# Patient Record
Sex: Male | Born: 1955 | Race: White | Hispanic: No | Smoking: Never smoker
Health system: Southern US, Community
[De-identification: ages and names within clinical notes are randomized; demographics above are authoritative.]

## PROBLEM LIST (undated history)

## (undated) HISTORY — PX: REFRACTIVE SURGERY: SHX103

## (undated) HISTORY — PX: ROTATOR CUFF REPAIR: SHX139

## (undated) HISTORY — PX: MENISCUS REPAIR: SHX5179

---

## 2000-02-08 ENCOUNTER — Other Ambulatory Visit: Admission: RE | Admit: 2000-02-08 | Discharge: 2000-02-08 | Payer: Self-pay | Admitting: Urology

## 2000-02-08 ENCOUNTER — Encounter (INDEPENDENT_AMBULATORY_CARE_PROVIDER_SITE_OTHER): Payer: Self-pay | Admitting: Specialist

## 2005-10-23 ENCOUNTER — Ambulatory Visit: Payer: Self-pay | Admitting: Gastroenterology

## 2005-11-01 ENCOUNTER — Ambulatory Visit: Payer: Self-pay | Admitting: Gastroenterology

## 2005-12-13 ENCOUNTER — Ambulatory Visit (HOSPITAL_BASED_OUTPATIENT_CLINIC_OR_DEPARTMENT_OTHER): Admission: RE | Admit: 2005-12-13 | Discharge: 2005-12-13 | Payer: Self-pay | Admitting: Orthopedic Surgery

## 2008-09-07 ENCOUNTER — Ambulatory Visit (HOSPITAL_COMMUNITY): Admission: RE | Admit: 2008-09-07 | Discharge: 2008-09-07 | Payer: Self-pay | Admitting: Otolaryngology

## 2009-10-09 ENCOUNTER — Ambulatory Visit (HOSPITAL_COMMUNITY): Admission: RE | Admit: 2009-10-09 | Discharge: 2009-10-09 | Payer: Self-pay | Admitting: Otolaryngology

## 2010-05-26 LAB — CBC
Hemoglobin: 15 g/dL (ref 13.0–17.0)
MCH: 30 pg (ref 26.0–34.0)
MCHC: 33.4 g/dL (ref 30.0–36.0)
Platelets: 283 10*3/uL (ref 150–400)

## 2010-05-26 LAB — SURGICAL PCR SCREEN
MRSA, PCR: NEGATIVE
Staphylococcus aureus: NEGATIVE

## 2010-06-18 LAB — CBC
Platelets: 275 10*3/uL (ref 150–400)
RDW: 13.3 % (ref 11.5–15.5)
WBC: 4.3 10*3/uL (ref 4.0–10.5)

## 2010-06-18 LAB — BASIC METABOLIC PANEL
BUN: 8 mg/dL (ref 6–23)
Calcium: 8.7 mg/dL (ref 8.4–10.5)
Creatinine, Ser: 0.71 mg/dL (ref 0.4–1.5)
GFR calc non Af Amer: >60 mL/min (ref >60)
Glucose, Bld: 113 mg/dL — ABNORMAL HIGH (ref 70–99)
Potassium: 4.4 mEq/L (ref 3.5–5.1)

## 2010-07-24 NOTE — Op Note (Signed)
[name], Earl Moore              ACCOUNT NO.:  [id number]   MEDICAL RECORD NO.:  [id number]          PATIENT TYPE:  AMB   LOCATION:  SDS                          FACILITY:  [facility]   PHYSICIAN:  Antony Contras, MD     DATE OF BIRTH:  May 18, 1955   DATE OF PROCEDURE:  09/07/2008  DATE OF DISCHARGE:  09/07/2008                               OPERATIVE REPORT   PREOPERATIVE DIAGNOSES:  1. Hoarseness.  2. Vocal fold atrophy.   POSTOPERATIVE DIAGNOSES:  1. Hoarseness.  2. Vocal fold atrophy.   PROCEDURE:  Suspended microdirect laryngoscopy with bilateral vocal cord  radius injections.   SURGEON:  Excell Seltzer. Jenne Pane, MD   ANESTHESIA:  General jet Venturi ventilation.   COMPLICATIONS:  None.   INDICATIONS:  The patient is a 55 year old white male who has taught  high school students for many years, but has found over the last 9  months or so that with more voice use comes fatiguing of voice and  throat soreness.  Examination of the larynx demonstrated slight gap with  phonation with slight bowing of the vocal folds.  Thus, he presents to  the operating room for surgical management.   FINDINGS:  Vocal folds are without mass, cysts, or ulceration.  Both  vocal folds have a mildly bowed appearance.   DESCRIPTION OF PROCEDURE:  The patient was identified in the holding  room and informed consent having been obtained including discussion of  risks, benefits, and alternatives.  The patient was brought to the  operative suite and put on the operating table in supine position.  Anesthesia was induced.  The patient was maintained via mask  ventilation.  The eyes were taped closed and the bed was turned to 90  degrees from anesthesia.  A tooth guard was placed and a Storz  laryngoscope was then put into place into the supraglottic position.  The jet Venturi system was then attached and the patient was  successfully jet ventilated throughout the remainder of the case.  The  vocal folds were  then photographed with a 0-degree telescope.  Radius  was then injected into both vocal fold starting on the right side and a  posterior position with 0.2 mL injected followed by the same on the left  side.  An anterior site was injected on both sides as well with 0.1 mL  on each side totaling 0.3 mL per side.  Excess injection was suctioned  and the vocal folds were then sprayed with 2% topical lidocaine.  A  postoperative photograph was taken, and the  laryngoscope was then taken out of suspension and then removed from the  patient's mouth while suctioning the airway.  He was then turned back to  Anesthesia for wake up under mask ventilation and was moved to the  recovery room in stable condition.      Antony Contras, MD  Electronically Signed     Antony Contras, MD  Electronically Signed    DDB/MEDQ  D:  09/07/2008  T:  09/08/2008  Job:  [id number]

## 2010-07-27 NOTE — Op Note (Signed)
NAME:  Earl Moore, Earl Moore              ACCOUNT NO.:  [id number]   MEDICAL RECORD NO.:  [id number]          PATIENT TYPE:  AMB   LOCATION:  DSC                          FACILITY:  [facility]   PHYSICIAN:  Harvie Junior, M.D.   DATE OF BIRTH:  17-May-1955   DATE OF PROCEDURE:  12/13/2005  DATE OF DISCHARGE:                                 OPERATIVE REPORT   PREOPERATIVE DIAGNOSIS:  Medial meniscal tear.   POSTOPERATIVE DIAGNOSES:  1. Medial meniscal tear.  2. Chondromalacia of patella.   PRINCIPLE PROCEDURES:  1. Partial posterior horn medial meniscectomy.  2. Debridement of medial patellar facet.   SURGEON:  Harvie Junior, M.D.   ASSISTANT:  Marshia Ly, P.A.   ANESTHESIA:  General.   BRIEF HISTORY:  [name] is a 55 year old male with a long history of  having had significant medial knee pain.  The patient had been treated with  arthroscopy about 20 years ago, but began having increasing pain, catching  and locking of the knee.  Because of continued pain, catching and locking,  the patient had an MRI performed, which showed that he had a posterior-horn  medial meniscal tear.  The patient was taken to the operating room for  debridement.   PROCEDURE:  The patient was taken to the operating room, and after adequate  anesthesia was obtained with general anesthetic, the patient was placed  supine on the operating table.  The left leg was prepped and draped in the  usual sterile fashion.  Following this, routine arthroscopic examination of  the knee revealed there was an obvious posterior-horn medial meniscal tear.  This was debrided back to a smooth and stable rim.  It was a little bit  flapped up medially, and this was debrided as well.  The remaining meniscal  rim was contoured down with a suction shaver.  A probe was used to make sure  it was stable.  Attention was turned to the medial femoral condyle, which  had really just some mild changes.  There was not any significant  chondral  injury.  Attention was turned to the ACL, which was normal and normal under  stretch.  Attention was turned laterally, where there was no significant  abnormality.  There may have been a touch of chondral issue posteriorly, but  not needing to be addressed at all.  Attention was turned back to the  patellofemoral joint, where the patellofemoral joint tracked well and looked  good, but on the medial patellar facet was a little bit of chondromalacia,  which was debrided.  At this point, the knee was copiously  irrigated and suctioned dry.  A final check was made for any loosened  fragments or pieces.  Seeing none, the knee was closed with a sterile and  compressive dressing, and the patient was taken to recovery, where she was  noted to be in satisfactory condition.  The estimated blood loss for the  procedure was negligible.      Harvie Junior, M.D.  Electronically Signed     JLG/MEDQ  D:  12/13/2005  T:  12/14/2005  Job:  [id number]

## 2011-09-04 ENCOUNTER — Telehealth: Payer: Self-pay | Admitting: Gastroenterology

## 2011-09-04 NOTE — Telephone Encounter (Signed)
Pulled up report on 11/01/2005, and pt had a normal, clean COLON. No chart available, but in this case recall would be in 2017. Phoned pt who states he's having no problems at present. I got cut off when I asked about his family history and am waiting on him to call back. I did speak with pt again and he would like up to run a COLON through and if [employer] denies the procedure, he would like an estimate on what [employer] will pay. He is traveling and will call me back.

## 2011-09-19 NOTE — Telephone Encounter (Signed)
Pt never called me back

## 2013-11-23 ENCOUNTER — Other Ambulatory Visit: Payer: Self-pay | Admitting: *Deleted

## 2018-04-06 ENCOUNTER — Emergency Department (HOSPITAL_COMMUNITY)
Admission: EM | Admit: 2018-04-06 | Discharge: 2018-04-07 | Disposition: A | Discharge status: Home / Self Care | Payer: BC Managed Care – PPO | Attending: Emergency Medicine | Admitting: Emergency Medicine

## 2018-04-06 ENCOUNTER — Other Ambulatory Visit: Payer: Self-pay

## 2018-04-06 ENCOUNTER — Emergency Department (HOSPITAL_COMMUNITY): Payer: BC Managed Care – PPO

## 2018-04-06 ENCOUNTER — Encounter (HOSPITAL_COMMUNITY): Payer: Self-pay

## 2018-04-06 DIAGNOSIS — Z79899 Other long term (current) drug therapy: Secondary | ICD-10-CM | POA: Insufficient documentation

## 2018-04-06 DIAGNOSIS — R1032 Left lower quadrant pain: Secondary | ICD-10-CM | POA: Diagnosis present

## 2018-04-06 DIAGNOSIS — K5732 Diverticulitis of large intestine without perforation or abscess without bleeding: Secondary | ICD-10-CM | POA: Insufficient documentation

## 2018-04-06 LAB — CBC
HCT: 44.7 % (ref 39.0–52.0)
Hemoglobin: 14.9 g/dL (ref 13.0–17.0)
MCH: 30.6 pg (ref 26.0–34.0)
MCHC: 33.3 g/dL (ref 30.0–36.0)
MCV: 91.8 fL (ref 80.0–100.0)
Platelets: 320 10*3/uL (ref 150–400)
RBC: 4.87 MIL/uL (ref 4.22–5.81)
RDW: 13.1 % (ref 11.5–15.5)
WBC: 9.2 10*3/uL (ref 4.0–10.5)
nRBC: 0 % (ref 0.0–0.2)

## 2018-04-06 LAB — BASIC METABOLIC PANEL
Anion gap: 8 (ref 5–15)
BUN: 10 mg/dL (ref 8–23)
CHLORIDE: 99 mmol/L (ref 98–111)
CO2: 30 mmol/L (ref 22–32)
CREATININE: 0.86 mg/dL (ref 0.61–1.24)
Calcium: 9.4 mg/dL (ref 8.9–10.3)
GFR calc non Af Amer: >60 mL/min (ref >60)
GLUCOSE: 113 mg/dL — AB (ref 70–99)
Potassium: 4.5 mmol/L (ref 3.5–5.1)
Sodium: 137 mmol/L (ref 135–145)

## 2018-04-06 LAB — URINALYSIS, ROUTINE W REFLEX MICROSCOPIC
Bilirubin Urine: NEGATIVE
GLUCOSE, UA: NEGATIVE mg/dL
HGB URINE DIPSTICK: NEGATIVE
KETONES UR: 5 mg/dL — AB
Leukocytes, UA: NEGATIVE
NITRITE: NEGATIVE
PH: 7 (ref 5.0–8.0)
Protein, ur: NEGATIVE mg/dL
SPECIFIC GRAVITY, URINE: 1.024 (ref 1.005–1.030)

## 2018-04-06 LAB — HEPATIC FUNCTION PANEL
ALBUMIN: 4.3 g/dL (ref 3.5–5.0)
ALT: 30 U/L (ref 0–44)
AST: 17 U/L (ref 15–41)
Alkaline Phosphatase: 58 U/L (ref 38–126)
Bilirubin, Direct: 0.2 mg/dL (ref 0.0–0.2)
Indirect Bilirubin: 0.6 mg/dL (ref 0.3–0.9)
Total Bilirubin: 0.8 mg/dL (ref 0.3–1.2)
Total Protein: 7 g/dL (ref 6.5–8.1)

## 2018-04-06 LAB — LIPASE, BLOOD: LIPASE: 30 U/L (ref 11–51)

## 2018-04-06 MED ORDER — SODIUM CHLORIDE 0.9 % IV BOLUS
500.0000 mL | Freq: Once | INTRAVENOUS | Status: AC
  Administered 2018-04-06: 500 mL via INTRAVENOUS

## 2018-04-06 MED ORDER — CIPROFLOXACIN HCL 500 MG PO TABS: 500.0000 mg | ORAL_TABLET | Freq: Two times a day (BID) | ORAL | 0 refills | Status: AC

## 2018-04-06 MED ORDER — SODIUM CHLORIDE (PF) 0.9 % IJ SOLN
INTRAMUSCULAR | Status: AC
  Filled 2018-04-06: qty 50

## 2018-04-06 MED ORDER — TRAMADOL HCL 50 MG PO TABS: 50.0000 mg | ORAL_TABLET | Freq: Four times a day (QID) | ORAL | 0 refills | Status: AC | PRN

## 2018-04-06 MED ORDER — METRONIDAZOLE 500 MG PO TABS: 500.0000 mg | ORAL_TABLET | Freq: Two times a day (BID) | ORAL | 0 refills | Status: AC

## 2018-04-06 MED ORDER — IOPAMIDOL (ISOVUE-300) INJECTION 61%
INTRAVENOUS | Status: AC
  Filled 2018-04-06: qty 100

## 2018-04-06 MED ORDER — METRONIDAZOLE IN NACL 5-0.79 MG/ML-% IV SOLN
500.0000 mg | Freq: Once | INTRAVENOUS | Status: AC
  Administered 2018-04-06: 500 mg via INTRAVENOUS
  Filled 2018-04-06: qty 100

## 2018-04-06 MED ORDER — IOPAMIDOL (ISOVUE-300) INJECTION 61%
100.0000 mL | Freq: Once | INTRAVENOUS | Status: AC | PRN
  Administered 2018-04-06: 100 mL via INTRAVENOUS

## 2018-04-06 MED ORDER — CIPROFLOXACIN IN D5W 400 MG/200ML IV SOLN
400.0000 mg | Freq: Once | INTRAVENOUS | Status: AC
  Administered 2018-04-06: 400 mg via INTRAVENOUS
  Filled 2018-04-06: qty 200

## 2018-04-06 NOTE — ED Provider Notes (Signed)
[facility]-EMERGENCY DEPT Provider Note   CSN: [id number] Arrival date & time: 04/06/18  1624     History   Chief Complaint Chief Complaint  Patient presents with  . Flank Pain  . Fever    HPI Earl Moore is a 63 y.o. male.  HPI Patient presents with lower abdominal pain and left flank pain which started yesterday.  States has had multiple episodes of loose stools.  He has had subjective fevers and chills.  He denies any nausea or vomiting.  Denies hematuria, dysuria, frequency or urgency.  States his pain has improved since being in the emergency department. History reviewed. No pertinent past medical history.  There are no active problems to display for this patient.   Past Surgical History:  Procedure Laterality Date  . MENISCUS REPAIR Bilateral   . REFRACTIVE SURGERY    . ROTATOR CUFF REPAIR Right         Home Medications    Prior to Admission medications   Medication Sig Start Date End Date Taking? Authorizing Provider  diphenhydramine-acetaminophen (TYLENOL PM) 25-500 MG TABS tablet Take 2 tablets by mouth at bedtime as needed (fever).   Yes [provider]  minoxidil (ROGAINE) 2 % external solution Apply 1 application topically daily.   Yes [provider]  Multiple Vitamins-Minerals (CENTRUM SILVER PO) Take 1 tablet by mouth daily.   Yes [provider]  OVER THE COUNTER MEDICATION Take 1 tablet by mouth daily.   Yes [provider]  ciprofloxacin (CIPRO) 500 MG tablet Take 1 tablet (500 mg total) by mouth 2 (two) times daily. One po bid x 7 days 04/06/18   Loren RacerYelverton, Leveta Wahab, MD  metroNIDAZOLE (FLAGYL) 500 MG tablet Take 1 tablet (500 mg total) by mouth 2 (two) times daily. One po bid x 7 days 04/06/18   Loren RacerYelverton, Earl Down, MD  traMADol (ULTRAM) 50 MG tablet Take 1 tablet (50 mg total) by mouth every 6 (six) hours as needed for moderate pain or severe pain. 04/06/18   Loren RacerYelverton, Earl Bautch, MD    Family  History No family history on file.  Social History Social History   Tobacco Use  . Smoking status: Never Smoker  . Smokeless tobacco: Never Used  Substance Use Topics  . Alcohol use: Yes    Alcohol/week: 20.0 standard drinks    Types: 20 Shots of liquor per week  . Drug use: Never     Allergies   Penicillins   Review of Systems Review of Systems  Constitutional: Positive for chills and fever. Negative for fatigue.  HENT: Negative for sore throat and trouble swallowing.   Respiratory: Negative for cough and shortness of breath.   Cardiovascular: Negative for chest pain.  Gastrointestinal: Positive for abdominal pain and diarrhea. Negative for blood in stool, constipation, nausea and vomiting.  Genitourinary: Positive for flank pain. Negative for dysuria, frequency, hematuria, penile pain, penile swelling and scrotal swelling.  Musculoskeletal: Positive for back pain and myalgias. Negative for neck pain and neck stiffness.  Skin: Negative for rash and wound.  Neurological: Negative for dizziness, weakness, light-headedness, numbness and headaches.  All other systems reviewed and are negative.    Physical Exam Updated Vital Signs BP 105/63 (BP Location: Left Arm)   Pulse 95   Temp 99.1 F (37.3 C) (Oral)   Resp 16   Ht 5\' 7"  (1.702 m)   Wt 77.6 kg   SpO2 98%   BMI 26.78 kg/m   Physical Exam Vitals signs and  nursing note reviewed.  Constitutional:      General: He is not in acute distress.    Appearance: Normal appearance. He is well-developed. He is not ill-appearing or toxic-appearing.  HENT:     Head: Normocephalic and atraumatic.     Nose: Nose normal.     Mouth/Throat:     Mouth: Mucous membranes are moist.     Pharynx: No oropharyngeal exudate or posterior oropharyngeal erythema.  Eyes:     Extraocular Movements: Extraocular movements intact.     Pupils: Pupils are equal, round, and reactive to light.  Neck:     Musculoskeletal: Normal range of motion  and neck supple. No neck rigidity or muscular tenderness.  Cardiovascular:     Rate and Rhythm: Normal rate and regular rhythm.     Heart sounds: No murmur. No friction rub. No gallop.   Pulmonary:     Effort: Pulmonary effort is normal.     Breath sounds: Normal breath sounds.  Abdominal:     General: Bowel sounds are normal. There is no distension.     Palpations: Abdomen is soft. There is no mass.     Tenderness: There is abdominal tenderness. There is no right CVA tenderness, left CVA tenderness, guarding or rebound.     Hernia: No hernia is present.     Comments: Left lower quadrant tenderness to palpation.  No rebound or guarding.  Musculoskeletal: Normal range of motion.        General: Tenderness present.     Right lower leg: No edema.     Left lower leg: No edema.     Comments: No definite CVA tenderness.  Patient does have some mild left lumbar paraspinal muscular tenderness to palpation.  No midline thoracic or lumbar tenderness.  No lower extremity swelling, asymmetry or tenderness.  2+ distal pulses in all extremities.  Lymphadenopathy:     Cervical: No cervical adenopathy.  Skin:    General: Skin is warm and dry.     Findings: No erythema or rash.  Neurological:     General: No focal deficit present.     Mental Status: He is alert and oriented to person, place, and time.  Psychiatric:        Mood and Affect: Mood normal.        Behavior: Behavior normal.      ED Treatments / Results  Labs (all labs ordered are listed, but only abnormal results are displayed) Labs Reviewed  URINALYSIS, ROUTINE W REFLEX MICROSCOPIC - Abnormal; Notable for the following components:      Result Value   Ketones, ur 5 (*)    All other components within normal limits  BASIC METABOLIC PANEL - Abnormal; Notable for the following components:   Glucose, Bld 113 (*)    All other components within normal limits  CBC  HEPATIC FUNCTION PANEL  LIPASE, BLOOD    EKG None  Radiology Ct  Abdomen Pelvis W Contrast  Result Date: 04/06/2018 CLINICAL DATA:  LEFT flank pain, fever. Constipation this morning, now with diarrhea. Suspect diverticulitis. EXAM: CT ABDOMEN AND PELVIS WITH CONTRAST TECHNIQUE: Multidetector CT imaging of the abdomen and pelvis was performed using the standard protocol following bolus administration of intravenous contrast. CONTRAST:  100mL ISOVUE-300 IOPAMIDOL (ISOVUE-300) INJECTION 61% COMPARISON:  None. FINDINGS: LOWER CHEST: RIGHT > LEFT lower lobe atelectasis/scar. Included heart size is normal. No pericardial effusion. HEPATOBILIARY: Liver and gallbladder are normal. PANCREAS: Normal. SPLEEN: Normal. ADRENALS/URINARY TRACT: Kidneys are orthotopic, demonstrating symmetric enhancement.  No nephrolithiasis, hydronephrosis or solid renal masses. The unopacified ureters are normal in course and caliber. Delayed imaging through the kidneys demonstrates symmetric prompt contrast excretion within the proximal urinary collecting system. Urinary bladder is partially distended and unremarkable. Normal adrenal glands. STOMACH/BOWEL: Mild colonic diverticulosis with superimposed acute inflammatory changes about a single descending colonic diverticula. Small and large bowel are normal in course and caliber. VASCULAR/LYMPHATIC: Aortoiliac vessels are normal in course and caliber. Mild calcific atherosclerosis. No lymphadenopathy by CT size criteria. REPRODUCTIVE: Normal. OTHER: Trace free fluid LEFT pericolic gutter without to peritoneal free air or focal fluid collection. MUSCULOSKELETAL: Nonacute. Small fat containing RIGHT inguinal hernia. Moderate L4-5 and moderate to severe L5-S1 spondylosis. IMPRESSION: 1. Acute uncomplicated descending colonic diverticulitis. Aortic Atherosclerosis (ICD10-I70.0). Electronically Signed   By: Awilda Metro M.D.   On: 04/06/2018 21:02    Procedures Procedures (including critical care time)  Medications Ordered in ED Medications  iopamidol  (ISOVUE-300) 61 % injection (has no administration in time range)  sodium chloride (PF) 0.9 % injection (has no administration in time range)  ciprofloxacin (CIPRO) IVPB 400 mg (400 mg Intravenous New Bag/Given 04/06/18 2202)  metroNIDAZOLE (FLAGYL) IVPB 500 mg (has no administration in time range)  sodium chloride 0.9 % bolus 500 mL (0 mLs Intravenous Stopped 04/06/18 2120)  iopamidol (ISOVUE-300) 61 % injection 100 mL (100 mLs Intravenous Contrast Given 04/06/18 2044)     Initial Impression / Assessment and Plan / ED Course  I have reviewed the triage vital signs and the nursing notes.  Pertinent labs & imaging results that were available during my care of the patient were reviewed by me and considered in my medical decision making (see chart for details).    CT with evidence of uncomplicated diverticulitis.  Will treat with IV antibiotics and discharge home with course of oral antibiotics.  Patient is well-appearing and in no acute distress.  Return precautions given.   Final Clinical Impressions(s) / ED Diagnoses   Final diagnoses:  Diverticulitis of large intestine without perforation or abscess without bleeding    ED Discharge Orders         Ordered    ciprofloxacin (CIPRO) 500 MG tablet  2 times daily     04/06/18 2243    metroNIDAZOLE (FLAGYL) 500 MG tablet  2 times daily     04/06/18 2243    traMADol (ULTRAM) 50 MG tablet  Every 6 hours PRN     04/06/18 2243           Loren Racer, MD 04/06/18 2243

## 2018-04-06 NOTE — ED Triage Notes (Signed)
Pt states he has been having left flank pain, fever, and "bubbly belly". Pt states his stomach has felt unsettled. Pt states that he was constipated this morning, but has had 5 or 6 liquid small BMs. Pt denies N/V.  Pt states that this started early Saturday with diarrhea . Pt states on Sunday afternoon, pt started getting the left flank pain, and developed a fever. Pt states he took tylenol PM, and woke up covered in sweat.  Pt states thermometer at home read 99.8; 100.1 at PCP. Pt states flank pain is worse with palpation.

## 2019-10-30 IMAGING — CT CT ABD-PELV W/ CM
2 of 5 series · 16 of 46 positions shown, 18 images · IV contrast (ISOVUE)
Comparison: None.

CLINICAL DATA: LEFT flank pain, fever. Constipation this morning,
now with diarrhea. Suspect diverticulitis.

EXAM:
CT ABDOMEN AND PELVIS WITH CONTRAST
TECHNIQUE: Multidetector CT imaging of the abdomen and pelvis was performed
using the standard protocol following bolus administration of
intravenous contrast.
CONTRAST:  100mL O4YVPV-RII IOPAMIDOL (O4YVPV-RII) INJECTION 61%

[Series 2: axial st · axial · 0.77mm/px · z∈[-498,-88]mm · 13 of 96 slices shown, 15 images]
[im 7/96  soft-tissue]
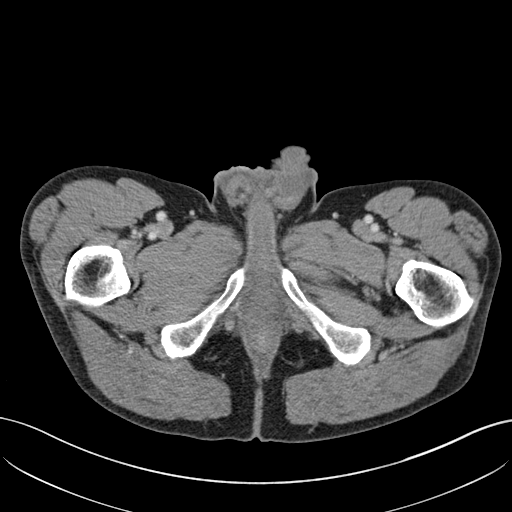
[im 7/96  bone]
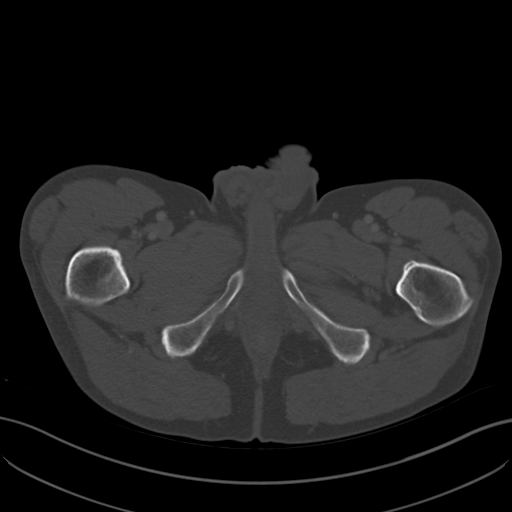
[im 13/96  soft-tissue]
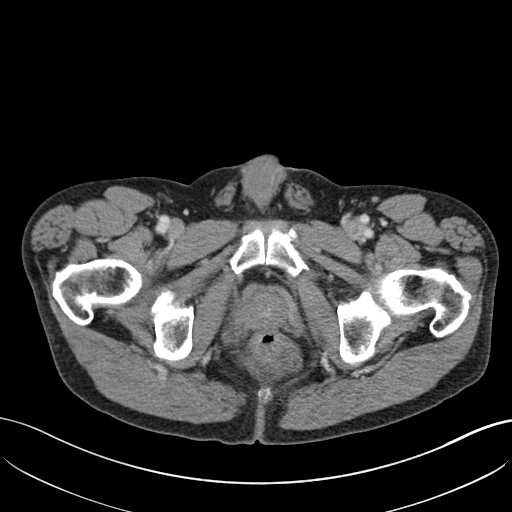
[im 20/96  soft-tissue]
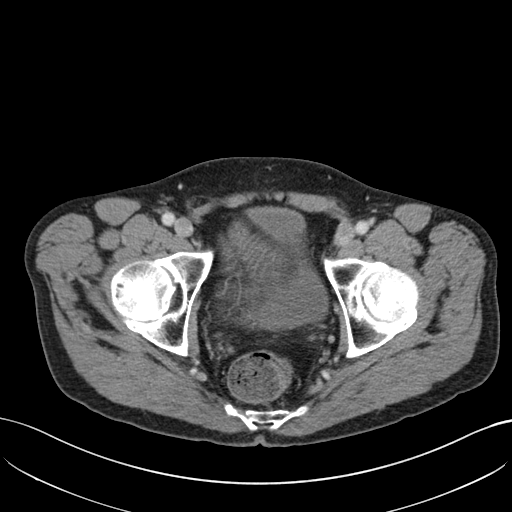
[im 26/96  soft-tissue]
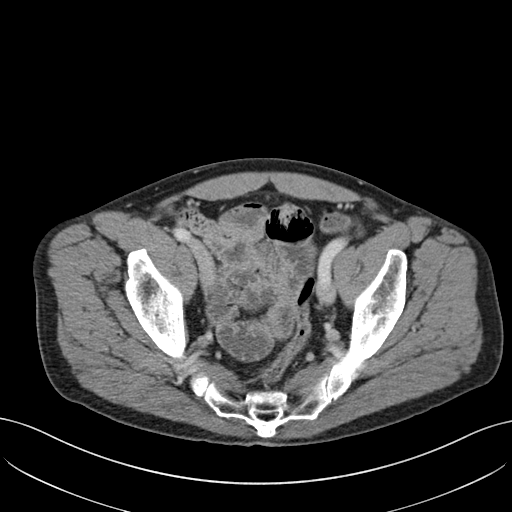
[im 32/96  soft-tissue]
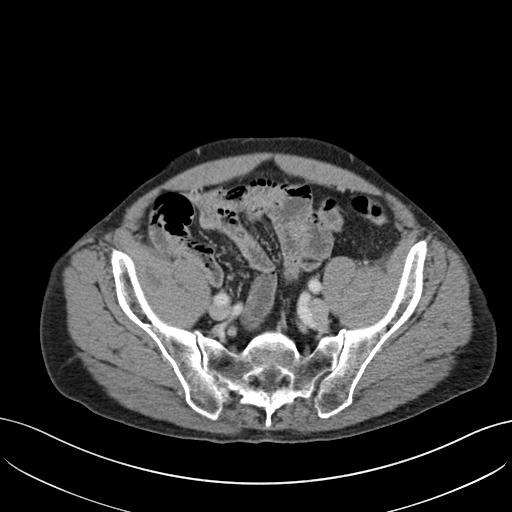
[im 39/96  soft-tissue]
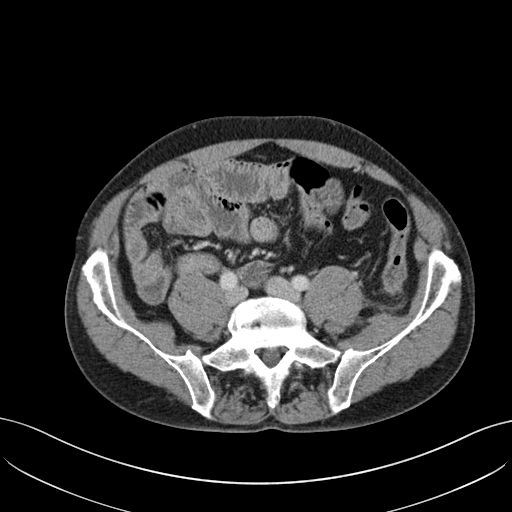
[im 51/96  soft-tissue]
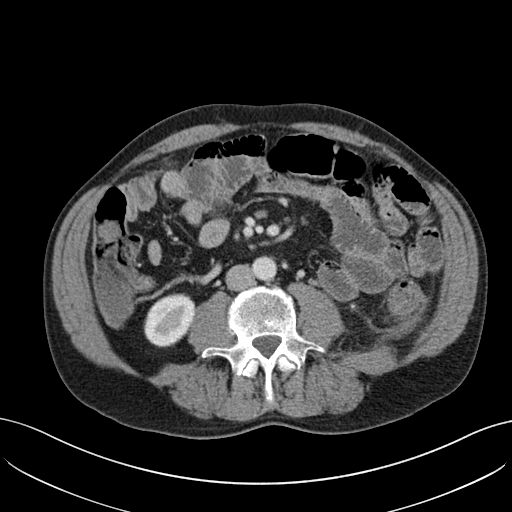
[im 58/96  soft-tissue]
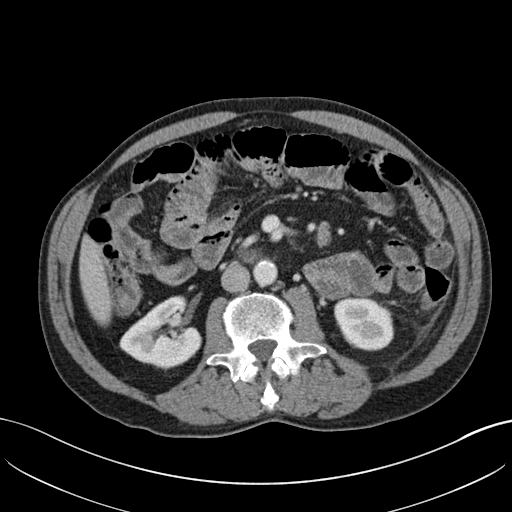
[im 64/96  soft-tissue]
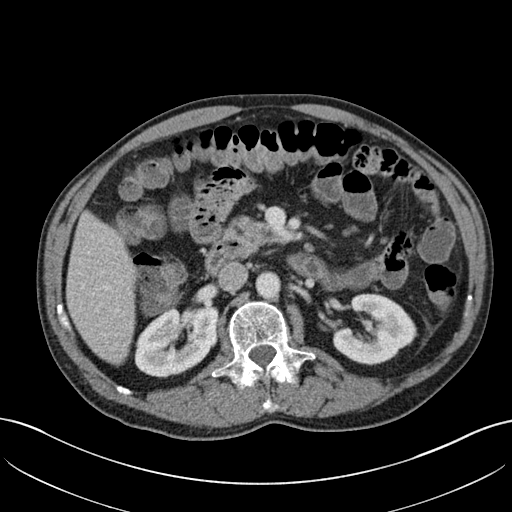
[im 64/96  bone]
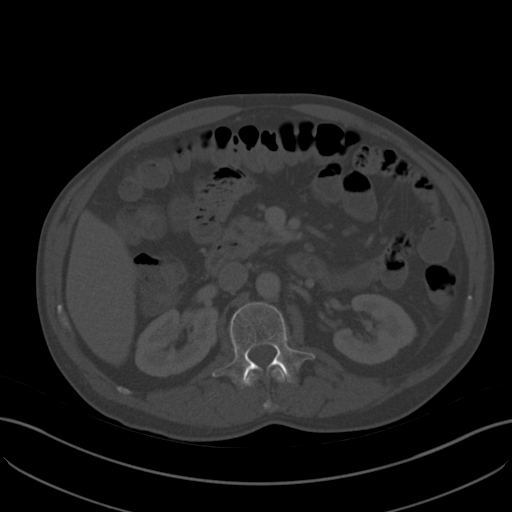
[im 70/96  soft-tissue]
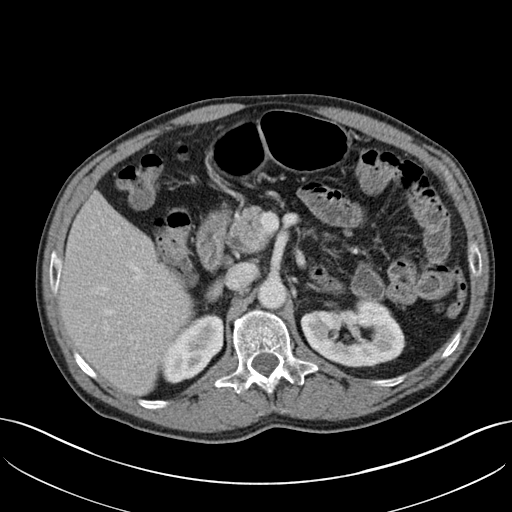
[im 77/96  soft-tissue]
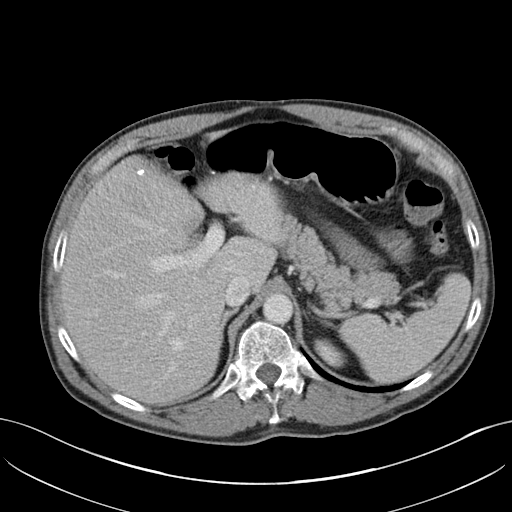
[im 83/96  soft-tissue]
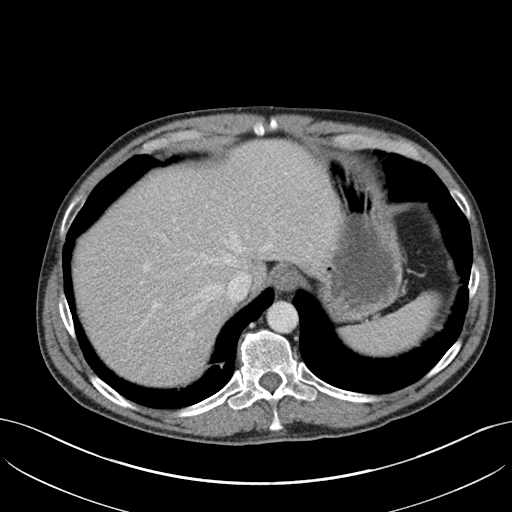
[im 89/96  soft-tissue]
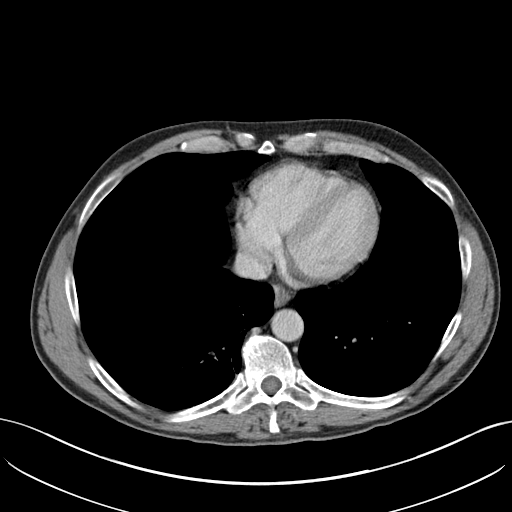

[Series 5: coronal st · coronal · 0.75mm/px · 3 of 87 slices shown]
[im 29/87  soft-tissue]
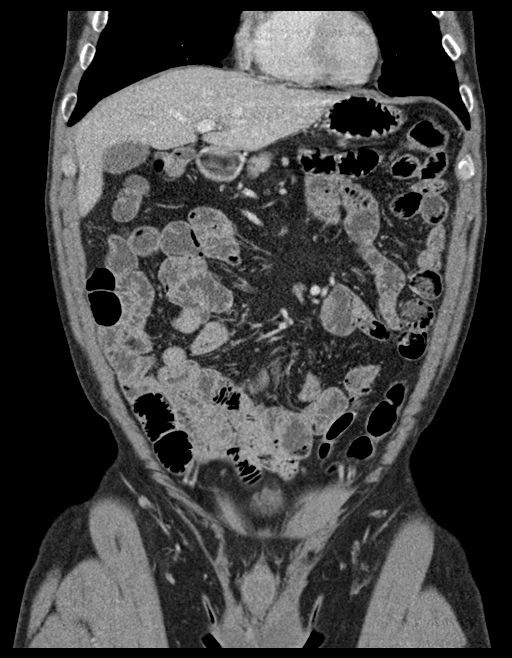
[im 39/87  soft-tissue]
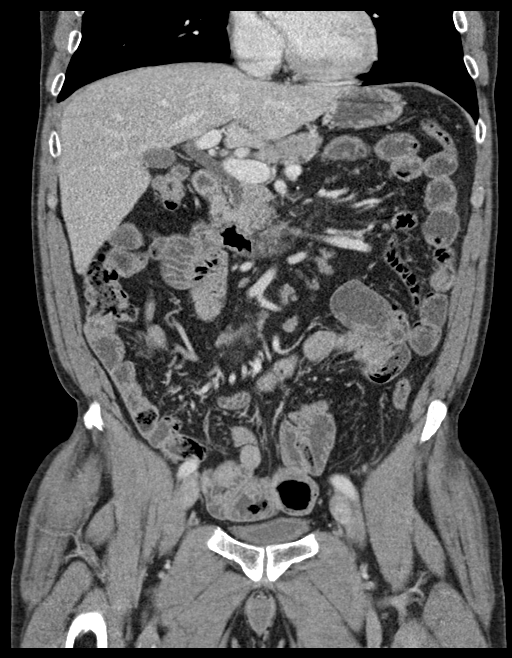
[im 48/87  soft-tissue]
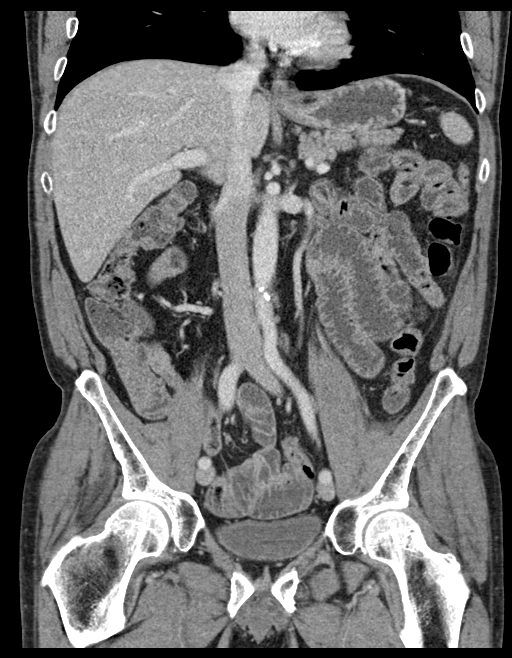

[16 of 46 positions shown; findings below may reference images not displayed]

FINDINGS: LOWER CHEST: RIGHT > LEFT lower lobe atelectasis/scar. Included
heart size is normal. No pericardial effusion.

HEPATOBILIARY: Liver and gallbladder are normal.

PANCREAS: Normal.

SPLEEN: Normal.

ADRENALS/URINARY TRACT: Kidneys are orthotopic, demonstrating
symmetric enhancement. No nephrolithiasis, hydronephrosis or solid
renal masses. The unopacified ureters are normal in course and
caliber. Delayed imaging through the kidneys demonstrates symmetric
prompt contrast excretion within the proximal urinary collecting
system. Urinary bladder is partially distended and unremarkable.
Normal adrenal glands.

STOMACH/BOWEL: Mild colonic diverticulosis with superimposed acute
inflammatory changes about a single descending colonic diverticula.
Small and large bowel are normal in course and caliber.

VASCULAR/LYMPHATIC: Aortoiliac vessels are normal in course and
caliber. Mild calcific atherosclerosis. No lymphadenopathy by CT
size criteria.

REPRODUCTIVE: Normal.

OTHER: Trace free fluid LEFT pericolic gutter without to peritoneal
free air or focal fluid collection.

MUSCULOSKELETAL: Nonacute. Small fat containing RIGHT inguinal
hernia. Moderate L4-5 and moderate to severe L5-S1 spondylosis.
IMPRESSION: 1. Acute uncomplicated descending colonic diverticulitis.

Aortic Atherosclerosis ([id number]).

## 2019-11-17 DIAGNOSIS — D225 Melanocytic nevi of trunk: Secondary | ICD-10-CM | POA: Diagnosis not present

## 2019-11-17 DIAGNOSIS — D1801 Hemangioma of skin and subcutaneous tissue: Secondary | ICD-10-CM | POA: Diagnosis not present

## 2019-11-17 DIAGNOSIS — L821 Other seborrheic keratosis: Secondary | ICD-10-CM | POA: Diagnosis not present

## 2019-11-17 DIAGNOSIS — B078 Other viral warts: Secondary | ICD-10-CM | POA: Diagnosis not present

## 2019-11-17 DIAGNOSIS — Z85828 Personal history of other malignant neoplasm of skin: Secondary | ICD-10-CM | POA: Diagnosis not present

## 2019-11-17 DIAGNOSIS — L570 Actinic keratosis: Secondary | ICD-10-CM | POA: Diagnosis not present

## 2019-11-23 DIAGNOSIS — Z0000 Encounter for general adult medical examination without abnormal findings: Secondary | ICD-10-CM | POA: Diagnosis not present

## 2019-11-23 DIAGNOSIS — E782 Mixed hyperlipidemia: Secondary | ICD-10-CM | POA: Diagnosis not present

## 2019-11-23 DIAGNOSIS — R739 Hyperglycemia, unspecified: Secondary | ICD-10-CM | POA: Diagnosis not present

## 2019-11-23 DIAGNOSIS — Z125 Encounter for screening for malignant neoplasm of prostate: Secondary | ICD-10-CM | POA: Diagnosis not present

## 2020-05-16 DIAGNOSIS — L570 Actinic keratosis: Secondary | ICD-10-CM | POA: Diagnosis not present

## 2020-05-16 DIAGNOSIS — L438 Other lichen planus: Secondary | ICD-10-CM | POA: Diagnosis not present

## 2020-05-16 DIAGNOSIS — D1801 Hemangioma of skin and subcutaneous tissue: Secondary | ICD-10-CM | POA: Diagnosis not present

## 2020-05-16 DIAGNOSIS — L821 Other seborrheic keratosis: Secondary | ICD-10-CM | POA: Diagnosis not present

## 2020-05-16 DIAGNOSIS — L814 Other melanin hyperpigmentation: Secondary | ICD-10-CM | POA: Diagnosis not present

## 2020-05-16 DIAGNOSIS — D225 Melanocytic nevi of trunk: Secondary | ICD-10-CM | POA: Diagnosis not present

## 2020-05-16 DIAGNOSIS — Z85828 Personal history of other malignant neoplasm of skin: Secondary | ICD-10-CM | POA: Diagnosis not present

## 2020-08-17 DIAGNOSIS — M7052 Other bursitis of knee, left knee: Secondary | ICD-10-CM | POA: Diagnosis not present

## 2020-11-16 DIAGNOSIS — D2261 Melanocytic nevi of right upper limb, including shoulder: Secondary | ICD-10-CM | POA: Diagnosis not present

## 2020-11-16 DIAGNOSIS — C44311 Basal cell carcinoma of skin of nose: Secondary | ICD-10-CM | POA: Diagnosis not present

## 2020-11-16 DIAGNOSIS — L821 Other seborrheic keratosis: Secondary | ICD-10-CM | POA: Diagnosis not present

## 2020-11-16 DIAGNOSIS — D485 Neoplasm of uncertain behavior of skin: Secondary | ICD-10-CM | POA: Diagnosis not present

## 2020-11-16 DIAGNOSIS — D1801 Hemangioma of skin and subcutaneous tissue: Secondary | ICD-10-CM | POA: Diagnosis not present

## 2020-11-16 DIAGNOSIS — D2262 Melanocytic nevi of left upper limb, including shoulder: Secondary | ICD-10-CM | POA: Diagnosis not present

## 2020-11-16 DIAGNOSIS — D225 Melanocytic nevi of trunk: Secondary | ICD-10-CM | POA: Diagnosis not present

## 2020-11-16 DIAGNOSIS — Z85828 Personal history of other malignant neoplasm of skin: Secondary | ICD-10-CM | POA: Diagnosis not present

## 2020-11-16 DIAGNOSIS — L570 Actinic keratosis: Secondary | ICD-10-CM | POA: Diagnosis not present

## 2020-11-24 DIAGNOSIS — R5383 Other fatigue: Secondary | ICD-10-CM | POA: Diagnosis not present

## 2020-11-24 DIAGNOSIS — E782 Mixed hyperlipidemia: Secondary | ICD-10-CM | POA: Diagnosis not present

## 2020-11-24 DIAGNOSIS — R7303 Prediabetes: Secondary | ICD-10-CM | POA: Diagnosis not present

## 2020-11-24 DIAGNOSIS — Z0000 Encounter for general adult medical examination without abnormal findings: Secondary | ICD-10-CM | POA: Diagnosis not present

## 2020-11-24 DIAGNOSIS — M545 Low back pain, unspecified: Secondary | ICD-10-CM | POA: Diagnosis not present

## 2020-11-24 DIAGNOSIS — Z6827 Body mass index (BMI) 27.0-27.9, adult: Secondary | ICD-10-CM | POA: Diagnosis not present

## 2020-11-24 DIAGNOSIS — Z125 Encounter for screening for malignant neoplasm of prostate: Secondary | ICD-10-CM | POA: Diagnosis not present

## 2020-12-25 DIAGNOSIS — C44311 Basal cell carcinoma of skin of nose: Secondary | ICD-10-CM | POA: Diagnosis not present

## 2020-12-25 DIAGNOSIS — Z85828 Personal history of other malignant neoplasm of skin: Secondary | ICD-10-CM | POA: Diagnosis not present

## 2021-05-07 DIAGNOSIS — R197 Diarrhea, unspecified: Secondary | ICD-10-CM | POA: Diagnosis not present

## 2021-05-16 DIAGNOSIS — D485 Neoplasm of uncertain behavior of skin: Secondary | ICD-10-CM | POA: Diagnosis not present

## 2021-05-16 DIAGNOSIS — D1801 Hemangioma of skin and subcutaneous tissue: Secondary | ICD-10-CM | POA: Diagnosis not present

## 2021-05-16 DIAGNOSIS — D225 Melanocytic nevi of trunk: Secondary | ICD-10-CM | POA: Diagnosis not present

## 2021-05-16 DIAGNOSIS — L814 Other melanin hyperpigmentation: Secondary | ICD-10-CM | POA: Diagnosis not present

## 2021-05-16 DIAGNOSIS — L821 Other seborrheic keratosis: Secondary | ICD-10-CM | POA: Diagnosis not present

## 2021-05-16 DIAGNOSIS — D2371 Other benign neoplasm of skin of right lower limb, including hip: Secondary | ICD-10-CM | POA: Diagnosis not present

## 2021-05-16 DIAGNOSIS — D2261 Melanocytic nevi of right upper limb, including shoulder: Secondary | ICD-10-CM | POA: Diagnosis not present

## 2021-05-16 DIAGNOSIS — Z85828 Personal history of other malignant neoplasm of skin: Secondary | ICD-10-CM | POA: Diagnosis not present

## 2021-05-16 DIAGNOSIS — L570 Actinic keratosis: Secondary | ICD-10-CM | POA: Diagnosis not present

## 2021-06-21 DIAGNOSIS — L570 Actinic keratosis: Secondary | ICD-10-CM | POA: Diagnosis not present

## 2021-06-21 DIAGNOSIS — Z85828 Personal history of other malignant neoplasm of skin: Secondary | ICD-10-CM | POA: Diagnosis not present

## 2021-11-16 DIAGNOSIS — D485 Neoplasm of uncertain behavior of skin: Secondary | ICD-10-CM | POA: Diagnosis not present

## 2021-11-16 DIAGNOSIS — D2371 Other benign neoplasm of skin of right lower limb, including hip: Secondary | ICD-10-CM | POA: Diagnosis not present

## 2021-11-16 DIAGNOSIS — D225 Melanocytic nevi of trunk: Secondary | ICD-10-CM | POA: Diagnosis not present

## 2021-11-16 DIAGNOSIS — Z85828 Personal history of other malignant neoplasm of skin: Secondary | ICD-10-CM | POA: Diagnosis not present

## 2021-11-16 DIAGNOSIS — L570 Actinic keratosis: Secondary | ICD-10-CM | POA: Diagnosis not present

## 2021-11-16 DIAGNOSIS — L812 Freckles: Secondary | ICD-10-CM | POA: Diagnosis not present

## 2021-11-16 DIAGNOSIS — L821 Other seborrheic keratosis: Secondary | ICD-10-CM | POA: Diagnosis not present

## 2021-11-16 DIAGNOSIS — L720 Epidermal cyst: Secondary | ICD-10-CM | POA: Diagnosis not present

## 2021-11-16 DIAGNOSIS — D1801 Hemangioma of skin and subcutaneous tissue: Secondary | ICD-10-CM | POA: Diagnosis not present

## 2021-11-22 DIAGNOSIS — H353121 Nonexudative age-related macular degeneration, left eye, early dry stage: Secondary | ICD-10-CM | POA: Diagnosis not present

## 2021-12-11 DIAGNOSIS — Z0000 Encounter for general adult medical examination without abnormal findings: Secondary | ICD-10-CM | POA: Diagnosis not present

## 2021-12-11 DIAGNOSIS — Z125 Encounter for screening for malignant neoplasm of prostate: Secondary | ICD-10-CM | POA: Diagnosis not present

## 2021-12-11 DIAGNOSIS — R7303 Prediabetes: Secondary | ICD-10-CM | POA: Diagnosis not present

## 2021-12-11 DIAGNOSIS — Z23 Encounter for immunization: Secondary | ICD-10-CM | POA: Diagnosis not present

## 2021-12-11 DIAGNOSIS — E782 Mixed hyperlipidemia: Secondary | ICD-10-CM | POA: Diagnosis not present

## 2022-04-10 DIAGNOSIS — M1712 Unilateral primary osteoarthritis, left knee: Secondary | ICD-10-CM | POA: Diagnosis not present

## 2022-04-10 DIAGNOSIS — M1711 Unilateral primary osteoarthritis, right knee: Secondary | ICD-10-CM | POA: Diagnosis not present

## 2022-04-10 DIAGNOSIS — M170 Bilateral primary osteoarthritis of knee: Secondary | ICD-10-CM | POA: Diagnosis not present

## 2022-04-10 DIAGNOSIS — M222X2 Patellofemoral disorders, left knee: Secondary | ICD-10-CM | POA: Diagnosis not present

## 2022-05-20 DIAGNOSIS — L905 Scar conditions and fibrosis of skin: Secondary | ICD-10-CM | POA: Diagnosis not present

## 2022-05-20 DIAGNOSIS — Z85828 Personal history of other malignant neoplasm of skin: Secondary | ICD-10-CM | POA: Diagnosis not present

## 2022-05-20 DIAGNOSIS — D2261 Melanocytic nevi of right upper limb, including shoulder: Secondary | ICD-10-CM | POA: Diagnosis not present

## 2022-05-20 DIAGNOSIS — D2371 Other benign neoplasm of skin of right lower limb, including hip: Secondary | ICD-10-CM | POA: Diagnosis not present

## 2022-05-20 DIAGNOSIS — L821 Other seborrheic keratosis: Secondary | ICD-10-CM | POA: Diagnosis not present

## 2022-05-20 DIAGNOSIS — L570 Actinic keratosis: Secondary | ICD-10-CM | POA: Diagnosis not present

## 2022-05-20 DIAGNOSIS — D225 Melanocytic nevi of trunk: Secondary | ICD-10-CM | POA: Diagnosis not present

## 2022-11-21 DIAGNOSIS — D2261 Melanocytic nevi of right upper limb, including shoulder: Secondary | ICD-10-CM | POA: Diagnosis not present

## 2022-11-21 DIAGNOSIS — Z85828 Personal history of other malignant neoplasm of skin: Secondary | ICD-10-CM | POA: Diagnosis not present

## 2022-11-21 DIAGNOSIS — L905 Scar conditions and fibrosis of skin: Secondary | ICD-10-CM | POA: Diagnosis not present

## 2022-11-21 DIAGNOSIS — D2371 Other benign neoplasm of skin of right lower limb, including hip: Secondary | ICD-10-CM | POA: Diagnosis not present

## 2022-11-21 DIAGNOSIS — L570 Actinic keratosis: Secondary | ICD-10-CM | POA: Diagnosis not present

## 2022-11-21 DIAGNOSIS — L814 Other melanin hyperpigmentation: Secondary | ICD-10-CM | POA: Diagnosis not present

## 2022-11-21 DIAGNOSIS — D225 Melanocytic nevi of trunk: Secondary | ICD-10-CM | POA: Diagnosis not present

## 2022-11-21 DIAGNOSIS — L821 Other seborrheic keratosis: Secondary | ICD-10-CM | POA: Diagnosis not present

## 2022-12-23 DIAGNOSIS — R7303 Prediabetes: Secondary | ICD-10-CM | POA: Diagnosis not present

## 2022-12-23 DIAGNOSIS — Z23 Encounter for immunization: Secondary | ICD-10-CM | POA: Diagnosis not present

## 2022-12-23 DIAGNOSIS — E782 Mixed hyperlipidemia: Secondary | ICD-10-CM | POA: Diagnosis not present

## 2022-12-23 DIAGNOSIS — K5790 Diverticulosis of intestine, part unspecified, without perforation or abscess without bleeding: Secondary | ICD-10-CM | POA: Diagnosis not present

## 2022-12-23 DIAGNOSIS — Z125 Encounter for screening for malignant neoplasm of prostate: Secondary | ICD-10-CM | POA: Diagnosis not present

## 2022-12-23 DIAGNOSIS — G629 Polyneuropathy, unspecified: Secondary | ICD-10-CM | POA: Diagnosis not present

## 2022-12-23 DIAGNOSIS — Z0000 Encounter for general adult medical examination without abnormal findings: Secondary | ICD-10-CM | POA: Diagnosis not present

## 2023-01-02 DIAGNOSIS — H2513 Age-related nuclear cataract, bilateral: Secondary | ICD-10-CM | POA: Diagnosis not present

## 2023-05-21 DIAGNOSIS — L821 Other seborrheic keratosis: Secondary | ICD-10-CM | POA: Diagnosis not present

## 2023-05-21 DIAGNOSIS — D2261 Melanocytic nevi of right upper limb, including shoulder: Secondary | ICD-10-CM | POA: Diagnosis not present

## 2023-05-21 DIAGNOSIS — L570 Actinic keratosis: Secondary | ICD-10-CM | POA: Diagnosis not present

## 2023-05-21 DIAGNOSIS — D2262 Melanocytic nevi of left upper limb, including shoulder: Secondary | ICD-10-CM | POA: Diagnosis not present

## 2023-05-21 DIAGNOSIS — Z85828 Personal history of other malignant neoplasm of skin: Secondary | ICD-10-CM | POA: Diagnosis not present

## 2023-05-21 DIAGNOSIS — D225 Melanocytic nevi of trunk: Secondary | ICD-10-CM | POA: Diagnosis not present

## 2023-06-27 DIAGNOSIS — L03012 Cellulitis of left finger: Secondary | ICD-10-CM | POA: Diagnosis not present

## 2023-06-27 DIAGNOSIS — L608 Other nail disorders: Secondary | ICD-10-CM | POA: Diagnosis not present

## 2023-08-07 DIAGNOSIS — L03012 Cellulitis of left finger: Secondary | ICD-10-CM | POA: Diagnosis not present

## 2023-08-29 DIAGNOSIS — L608 Other nail disorders: Secondary | ICD-10-CM | POA: Diagnosis not present

## 2023-08-29 DIAGNOSIS — M7130 Other bursal cyst, unspecified site: Secondary | ICD-10-CM | POA: Diagnosis not present

## 2023-09-08 DIAGNOSIS — Z09 Encounter for follow-up examination after completed treatment for conditions other than malignant neoplasm: Secondary | ICD-10-CM | POA: Diagnosis not present

## 2023-09-08 DIAGNOSIS — Z860101 Personal history of adenomatous and serrated colon polyps: Secondary | ICD-10-CM | POA: Diagnosis not present

## 2023-09-08 DIAGNOSIS — K5730 Diverticulosis of large intestine without perforation or abscess without bleeding: Secondary | ICD-10-CM | POA: Diagnosis not present

## 2023-09-09 DIAGNOSIS — Z85828 Personal history of other malignant neoplasm of skin: Secondary | ICD-10-CM | POA: Diagnosis not present

## 2023-09-09 DIAGNOSIS — M7130 Other bursal cyst, unspecified site: Secondary | ICD-10-CM | POA: Diagnosis not present

## 2023-11-03 DIAGNOSIS — M67442 Ganglion, left hand: Secondary | ICD-10-CM | POA: Diagnosis not present

## 2023-11-24 DIAGNOSIS — L814 Other melanin hyperpigmentation: Secondary | ICD-10-CM | POA: Diagnosis not present

## 2023-11-24 DIAGNOSIS — M7130 Other bursal cyst, unspecified site: Secondary | ICD-10-CM | POA: Diagnosis not present

## 2023-11-24 DIAGNOSIS — L821 Other seborrheic keratosis: Secondary | ICD-10-CM | POA: Diagnosis not present

## 2023-11-24 DIAGNOSIS — L905 Scar conditions and fibrosis of skin: Secondary | ICD-10-CM | POA: Diagnosis not present

## 2023-11-24 DIAGNOSIS — D2261 Melanocytic nevi of right upper limb, including shoulder: Secondary | ICD-10-CM | POA: Diagnosis not present

## 2023-11-24 DIAGNOSIS — Z85828 Personal history of other malignant neoplasm of skin: Secondary | ICD-10-CM | POA: Diagnosis not present

## 2023-11-24 DIAGNOSIS — D225 Melanocytic nevi of trunk: Secondary | ICD-10-CM | POA: Diagnosis not present

## 2023-11-24 DIAGNOSIS — D2262 Melanocytic nevi of left upper limb, including shoulder: Secondary | ICD-10-CM | POA: Diagnosis not present

## 2023-12-10 DIAGNOSIS — M67442 Ganglion, left hand: Secondary | ICD-10-CM | POA: Diagnosis not present

## 2023-12-10 DIAGNOSIS — Z4789 Encounter for other orthopedic aftercare: Secondary | ICD-10-CM | POA: Diagnosis not present

## 2024-01-02 DIAGNOSIS — E782 Mixed hyperlipidemia: Secondary | ICD-10-CM | POA: Diagnosis not present

## 2024-01-02 DIAGNOSIS — Z23 Encounter for immunization: Secondary | ICD-10-CM | POA: Diagnosis not present

## 2024-01-02 DIAGNOSIS — R7303 Prediabetes: Secondary | ICD-10-CM | POA: Diagnosis not present

## 2024-01-02 DIAGNOSIS — Z6827 Body mass index (BMI) 27.0-27.9, adult: Secondary | ICD-10-CM | POA: Diagnosis not present

## 2024-01-02 DIAGNOSIS — K5790 Diverticulosis of intestine, part unspecified, without perforation or abscess without bleeding: Secondary | ICD-10-CM | POA: Diagnosis not present

## 2024-01-02 DIAGNOSIS — M545 Low back pain, unspecified: Secondary | ICD-10-CM | POA: Diagnosis not present

## 2024-01-02 DIAGNOSIS — Z0000 Encounter for general adult medical examination without abnormal findings: Secondary | ICD-10-CM | POA: Diagnosis not present

## 2024-01-02 DIAGNOSIS — G629 Polyneuropathy, unspecified: Secondary | ICD-10-CM | POA: Diagnosis not present

## 2024-01-02 DIAGNOSIS — Z125 Encounter for screening for malignant neoplasm of prostate: Secondary | ICD-10-CM | POA: Diagnosis not present

## 2024-01-16 DIAGNOSIS — Z23 Encounter for immunization: Secondary | ICD-10-CM | POA: Diagnosis not present
